# Patient Record
Sex: Male | Born: 1990 | Race: White | Hispanic: No | Marital: Single | Smoking: Former smoker
Health system: Southern US, Community
[De-identification: ages and names within clinical notes are randomized; demographics above are authoritative.]

## PROBLEM LIST (undated history)

## (undated) DIAGNOSIS — J45909 Unspecified asthma, uncomplicated: Secondary | ICD-10-CM

## (undated) DIAGNOSIS — Q048 Other specified congenital malformations of brain: Secondary | ICD-10-CM

## (undated) DIAGNOSIS — T7840XA Allergy, unspecified, initial encounter: Secondary | ICD-10-CM

## (undated) DIAGNOSIS — F411 Generalized anxiety disorder: Secondary | ICD-10-CM

## (undated) DIAGNOSIS — T744XXA Shaken infant syndrome, initial encounter: Secondary | ICD-10-CM

## (undated) DIAGNOSIS — Q046 Congenital cerebral cysts: Secondary | ICD-10-CM

## (undated) DIAGNOSIS — U071 COVID-19: Secondary | ICD-10-CM

## (undated) DIAGNOSIS — F4310 Post-traumatic stress disorder, unspecified: Secondary | ICD-10-CM

## (undated) DIAGNOSIS — R569 Unspecified convulsions: Secondary | ICD-10-CM

## (undated) DIAGNOSIS — F329 Major depressive disorder, single episode, unspecified: Secondary | ICD-10-CM

## (undated) DIAGNOSIS — Q049 Congenital malformation of brain, unspecified: Secondary | ICD-10-CM

## (undated) DIAGNOSIS — Q860 Fetal alcohol syndrome (dysmorphic): Secondary | ICD-10-CM

## (undated) HISTORY — DX: Congenital malformation of brain, unspecified: Q04.9

## (undated) HISTORY — DX: Other specified congenital malformations of brain: Q04.8

## (undated) HISTORY — DX: COVID-19: U07.1

## (undated) HISTORY — DX: Post-traumatic stress disorder, unspecified: F43.10

## (undated) HISTORY — DX: Allergy, unspecified, initial encounter: T78.40XA

## (undated) HISTORY — DX: Congenital cerebral cysts: Q04.6

## (undated) HISTORY — PX: HERNIA REPAIR: SHX51

## (undated) HISTORY — DX: Shaken infant syndrome, initial encounter: T74.4XXA

## (undated) HISTORY — DX: Unspecified convulsions: R56.9

## (undated) HISTORY — DX: Unspecified asthma, uncomplicated: J45.909

---
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
## 2016-02-19 ENCOUNTER — Emergency Department (HOSPITAL_COMMUNITY): Payer: Medicare Other

## 2016-02-19 ENCOUNTER — Encounter (HOSPITAL_COMMUNITY): Payer: Self-pay | Admitting: Emergency Medicine

## 2016-02-19 ENCOUNTER — Emergency Department (HOSPITAL_COMMUNITY)
Admission: EM | Admit: 2016-02-19 | Discharge: 2016-02-19 | Disposition: A | Discharge status: Home / Self Care | Payer: Medicare Other | Attending: Emergency Medicine | Admitting: Emergency Medicine

## 2016-02-19 DIAGNOSIS — Y929 Unspecified place or not applicable: Secondary | ICD-10-CM | POA: Insufficient documentation

## 2016-02-19 DIAGNOSIS — Y939 Activity, unspecified: Secondary | ICD-10-CM | POA: Diagnosis not present

## 2016-02-19 DIAGNOSIS — S6991XA Unspecified injury of right wrist, hand and finger(s), initial encounter: Secondary | ICD-10-CM | POA: Diagnosis present

## 2016-02-19 DIAGNOSIS — Y999 Unspecified external cause status: Secondary | ICD-10-CM | POA: Diagnosis not present

## 2016-02-19 DIAGNOSIS — S6391XA Sprain of unspecified part of right wrist and hand, initial encounter: Secondary | ICD-10-CM | POA: Diagnosis not present

## 2016-02-19 MED ORDER — IBUPROFEN 400 MG PO TABS
800.0000 mg | ORAL_TABLET | Freq: Once | ORAL | Status: AC
  Administered 2016-02-19: 800 mg via ORAL
  Filled 2016-02-19: qty 2

## 2016-02-19 MED ORDER — IBUPROFEN 800 MG PO TABS: 800.0000 mg | ORAL_TABLET | Freq: Three times a day (TID) | ORAL | 0 refills | Status: DC
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
## 2016-02-19 NOTE — Discharge Instructions (Signed)
Take ibuprofen as needed for pain. Rest, ice, elevation and compression. Limit movement. Please return if symptoms fail to improve.

## 2016-04-23 DIAGNOSIS — J4520 Mild intermittent asthma, uncomplicated: Secondary | ICD-10-CM | POA: Insufficient documentation

## 2016-04-23 DIAGNOSIS — Q860 Fetal alcohol syndrome (dysmorphic): Secondary | ICD-10-CM | POA: Insufficient documentation

## 2016-04-23 DIAGNOSIS — F259 Schizoaffective disorder, unspecified: Secondary | ICD-10-CM

## 2016-04-23 DIAGNOSIS — T744XXA Shaken infant syndrome, initial encounter: Secondary | ICD-10-CM | POA: Insufficient documentation

## 2016-04-23 DIAGNOSIS — G40909 Epilepsy, unspecified, not intractable, without status epilepticus: Secondary | ICD-10-CM | POA: Insufficient documentation

## 2016-04-23 DIAGNOSIS — I4581 Long QT syndrome: Secondary | ICD-10-CM | POA: Insufficient documentation

## 2016-04-23 DIAGNOSIS — Z593 Problems related to living in residential institution: Secondary | ICD-10-CM | POA: Insufficient documentation

## 2016-04-23 DIAGNOSIS — F4310 Post-traumatic stress disorder, unspecified: Secondary | ICD-10-CM | POA: Insufficient documentation

## 2016-04-23 DIAGNOSIS — F329 Major depressive disorder, single episode, unspecified: Secondary | ICD-10-CM

## 2016-04-23 DIAGNOSIS — Z7289 Other problems related to lifestyle: Secondary | ICD-10-CM | POA: Insufficient documentation

## 2016-04-23 DIAGNOSIS — Z79899 Other long term (current) drug therapy: Secondary | ICD-10-CM | POA: Insufficient documentation

## 2016-04-23 HISTORY — DX: Major depressive disorder, single episode, unspecified: F32.9

## 2016-04-23 HISTORY — DX: Fetal alcohol syndrome (dysmorphic): Q86.0

## 2016-04-23 HISTORY — DX: Schizoaffective disorder, unspecified: F25.9

## 2016-07-30 DIAGNOSIS — R635 Abnormal weight gain: Secondary | ICD-10-CM | POA: Insufficient documentation

## 2016-07-30 DIAGNOSIS — E669 Obesity, unspecified: Secondary | ICD-10-CM | POA: Insufficient documentation

## 2017-05-03 DIAGNOSIS — E871 Hypo-osmolality and hyponatremia: Secondary | ICD-10-CM | POA: Insufficient documentation
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
## 2018-07-06 ENCOUNTER — Encounter: Payer: Self-pay | Admitting: Neurology

## 2018-07-07 ENCOUNTER — Telehealth: Payer: Self-pay | Admitting: Neurology

## 2018-09-20 ENCOUNTER — Other Ambulatory Visit: Payer: Self-pay

## 2018-09-20 ENCOUNTER — Encounter: Payer: Self-pay | Admitting: Neurology

## 2018-09-20 ENCOUNTER — Ambulatory Visit (INDEPENDENT_AMBULATORY_CARE_PROVIDER_SITE_OTHER): Payer: Medicare Other | Admitting: Neurology

## 2018-09-20 VITALS — BP 112/76 | HR 69 | Ht 72.0 in | Wt 258.0 lb

## 2018-09-20 DIAGNOSIS — G40209 Localization-related (focal) (partial) symptomatic epilepsy and epileptic syndromes with complex partial seizures, not intractable, without status epilepticus: Secondary | ICD-10-CM

## 2018-09-20 DIAGNOSIS — Q048 Other specified congenital malformations of brain: Secondary | ICD-10-CM

## 2018-09-20 DIAGNOSIS — F989 Unspecified behavioral and emotional disorders with onset usually occurring in childhood and adolescence: Secondary | ICD-10-CM | POA: Diagnosis not present

## 2018-09-20 MED ORDER — CARBAMAZEPINE ER 200 MG PO CP12: ORAL_CAPSULE | ORAL | 11 refills | Status: DC
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
## 2018-09-26 ENCOUNTER — Other Ambulatory Visit: Payer: Self-pay | Admitting: Neurology

## 2018-09-26 ENCOUNTER — Encounter: Payer: Self-pay | Admitting: Neurology

## 2018-09-26 ENCOUNTER — Ambulatory Visit (INDEPENDENT_AMBULATORY_CARE_PROVIDER_SITE_OTHER): Payer: Medicare Other | Admitting: Neurology

## 2018-09-26 DIAGNOSIS — Q048 Other specified congenital malformations of brain: Secondary | ICD-10-CM

## 2018-09-26 DIAGNOSIS — F989 Unspecified behavioral and emotional disorders with onset usually occurring in childhood and adolescence: Secondary | ICD-10-CM | POA: Diagnosis not present

## 2018-09-26 DIAGNOSIS — G40209 Localization-related (focal) (partial) symptomatic epilepsy and epileptic syndromes with complex partial seizures, not intractable, without status epilepticus: Secondary | ICD-10-CM | POA: Diagnosis not present

## 2018-09-26 MED ORDER — CARBAMAZEPINE ER 200 MG PO CP12: ORAL_CAPSULE | ORAL | 11 refills | Status: DC

## 2018-09-26 NOTE — Telephone Encounter (Signed)
Carbatrol 200mg  #180 with 11 refills  Forwarded to Dr. Karel Jarvis for approval

## 2018-09-26 NOTE — Telephone Encounter (Signed)
Patient is here for EEG and asked that the carbamazepine medication be sent to the [facility] [address]. Please send this in. Thanks!

## 2018-09-28 ENCOUNTER — Telehealth: Payer: Self-pay | Admitting: Neurology

## 2018-09-28 NOTE — Telephone Encounter (Signed)
Father is calling in about referral that was sent for neuropsych testing to the wrong place. He said it needed to be sent to [facility]? Please call him back at [phone]. He seems confused. Thanks!

## 2018-09-29 NOTE — Telephone Encounter (Signed)
Spoke with pt's father, Austin Kennedy, who states that we were to send referral to behavior health for medication management.  Advised that referral was for management of behavior changes.  Austin Kennedy states that pt already has therapist for that.  Asks for referral for psychiatrist, who can manage pt's medications.  Also states that Dr. Karel Jarvis told him that she would refer to someone that she works "very closely with"  (I am unaware of anyone other that maybe Dr. Donell Beers?)    Dr. Karel Jarvis, pls advise.

## 2018-09-30 NOTE — Telephone Encounter (Signed)
I think there was confusion with the referrals. We wanted to refer him to [facility] for Psychiatry eval and treatment (not counseling). I told them that a lot of our patients have psychiatric conditions so we work closely with psychiatry (not necessarily a specific physician). The other referral (which I think is where the confusion is), is for Neuropsych testing at our office once Dr. Milbert Coulter joins [place]. Maybe the Neuropsych referral was sent to [facility], but it was supposed to be an internal referral here in our office for the future.  Thanks!

## 2018-10-06 NOTE — Procedures (Signed)
ELECTROENCEPHALOGRAM REPORT  Date of Study: 09/26/2018  Patient's Name: Austin Kennedy MRN: [id number] Date of Birth: July 15, 1991  Referring Provider: Dr. Patrcia Dolly  Clinical History: This is a 28 year old man with a history of right schizencephaly, frontal cortical dysplasia, with increasing agitation and rage reactions, EEG for classification  Medications: CARBATROL 200 MG 12 hr capsule  KLONOPIN 1 MG tablet  HALDOL 5 MGtablet   ADVIL,MOTRIN 800 MG tablet  CLARITIN 10 MG tablet  FISH OIL 1200 MG CPDR  GEODON 40 MG capsule   Technical Summary: A multichannel digital 1-hour EEG recording measured by the international 10-20 system with electrodes applied with paste and impedances below 5000 ohms performed in our laboratory with EKG monitoring in an awake and asleep patient.  Hyperventilation and photic stimulation were performed.  The digital EEG was referentially recorded, reformatted, and digitally filtered in a variety of bipolar and referential montages for optimal display.    Description: The patient is awake and asleep during the recording.  During maximal wakefulness, there is a symmetric, medium voltage 9.5-10 Hz posterior dominant rhythm that attenuates with eye opening.  There is rare focal 4-5 Hz theta slowing seen over the right temporal region during drowsiness. During drowsiness and sleep, there is an increase in theta slowing of the background.  Vertex waves and symmetric sleep spindles were seen.  Hyperventilation and photic stimulation did not elicit any abnormalities.  There were no epileptiform discharges or electrographic seizures seen.    EKG lead was unremarkable.  Impression: This 1-hour awake and asleep EEG is abnormal due to rare focal slowing over the right temporal region.  Clinical Correlation of the above findings indicates focal cerebral dysfunction over the right temporal region suggestive of underlying structural or physiologic abnormality. The  absence of epileptiform discharges does not exclude a clinical diagnosis of epilepsy. Clinical correlation is advised.   Patrcia Dolly, M.D.

## 2019-01-17 ENCOUNTER — Telehealth: Payer: Self-pay | Admitting: Neurology

## 2019-01-17 ENCOUNTER — Other Ambulatory Visit: Payer: Self-pay

## 2019-01-17 DIAGNOSIS — G40209 Localization-related (focal) (partial) symptomatic epilepsy and epileptic syndromes with complex partial seizures, not intractable, without status epilepticus: Secondary | ICD-10-CM

## 2019-01-17 DIAGNOSIS — F989 Unspecified behavioral and emotional disorders with onset usually occurring in childhood and adolescence: Secondary | ICD-10-CM

## 2019-01-17 DIAGNOSIS — Q048 Other specified congenital malformations of brain: Secondary | ICD-10-CM

## 2019-01-17 MED ORDER — CARBAMAZEPINE ER 200 MG PO CP12: ORAL_CAPSULE | ORAL | 11 refills | Status: DC

## 2019-01-17 NOTE — Telephone Encounter (Signed)
Thanks for getting records. Yes I had talked to them about 2 referrals:  1. Psychiatry with [facility] (he already has a therapist, he needs medication management)  2. Neuropsychological testing with Dr. Melvyn Novas  Sounds like he has not heard back yet from [facility], pls confirm that he also wanted referral with Psychiatry for medication management then send referral. Thanks!

## 2019-01-17 NOTE — Addendum Note (Signed)
Addended by: Cameron Sprang on: 01/17/2019 04:06 PM   Modules accepted: Orders

## 2019-01-17 NOTE — Telephone Encounter (Signed)
Guardian father is calling in about psychiatrist referral. He hasn't heard anything from anyone and he is saying that it has been awhile since the referral was supposedly put in. He said you can call him back at [phone] or the mother [name] at [phone]. Thanks!

## 2019-01-17 NOTE — Telephone Encounter (Signed)
I had to put in a new referral for Behavioral Health. The receptionist at [facility] location said pt would get in sooner at the [facility] location with Dr. De Nurse. Their office will call the pt with an appt date and time. This practice will be able to manage behavioral changes and his medication (dad was concerned about this).  Dad requests a refill of carbamazepine in the meantime. Would you send in a refill please? He needs 1200mg  qd per dad to manage.

## 2019-01-17 NOTE — Telephone Encounter (Signed)
Pls let dad know I sent in refills for what I did the last visit, 200mg  3 tabs twice a day (which is a total of 1200mg  daily dose). Thanks!

## 2019-01-17 NOTE — Telephone Encounter (Signed)
Dad informed of med refill and referral placed to [facility].

## 2019-01-17 NOTE — Telephone Encounter (Signed)
Pts dad states that he went to [facility] health and he was told that they did not have psychiatrist there.  I presume they went to the wrong office. I will call and check and arrange an appt. Referral has alread been placed. Number is [phone].  Pt needs a refill of carbamazepine. He needs 1200 qd. The [facility] location decreased to 600mg  qd and pt has been very unstable per dad.

## 2019-01-17 NOTE — Telephone Encounter (Addendum)
Spoke with pts father. He states that pt is not well and that he needs psychiatric stability help.  Pt has increased to 1200 of carbamazepine daily.  Dad requested that we communicate with Jalene Mullet at [place] about pt.  Informed dad that Dr. Delice Lesch would like for pt to see Dr. Melvyn Novas in office. This will be in August. Dad was appreciative of that.  I have left a message at [facility] office to fax over office notes

## 2019-02-27 ENCOUNTER — Encounter (HOSPITAL_COMMUNITY): Payer: Self-pay | Admitting: Psychiatry

## 2019-02-27 ENCOUNTER — Ambulatory Visit (INDEPENDENT_AMBULATORY_CARE_PROVIDER_SITE_OTHER): Payer: Medicaid Other | Admitting: Psychiatry

## 2019-02-27 DIAGNOSIS — F4310 Post-traumatic stress disorder, unspecified: Secondary | ICD-10-CM

## 2019-02-27 DIAGNOSIS — F258 Other schizoaffective disorders: Secondary | ICD-10-CM

## 2019-02-27 DIAGNOSIS — F259 Schizoaffective disorder, unspecified: Secondary | ICD-10-CM

## 2019-02-27 DIAGNOSIS — Q860 Fetal alcohol syndrome (dysmorphic): Secondary | ICD-10-CM | POA: Diagnosis not present

## 2019-02-27 DIAGNOSIS — G40909 Epilepsy, unspecified, not intractable, without status epilepticus: Secondary | ICD-10-CM

## 2019-02-27 DIAGNOSIS — F0630 Mood disorder due to known physiological condition, unspecified: Secondary | ICD-10-CM | POA: Diagnosis not present

## 2019-02-27 MED ORDER — CLONAZEPAM 1 MG PO TABS: ORAL_TABLET | ORAL | 1 refills | Status: DC

## 2019-02-27 MED ORDER — HALOPERIDOL 5 MG PO TABS: 2.5000 mg | ORAL_TABLET | Freq: Every day | ORAL | 2 refills | Status: DC

## 2019-02-27 MED ORDER — ZIPRASIDONE HCL 40 MG PO CAPS: 40.0000 mg | ORAL_CAPSULE | Freq: Two times a day (BID) | ORAL | 1 refills | Status: DC
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
## 2019-03-10 ENCOUNTER — Telehealth (HOSPITAL_COMMUNITY): Payer: Self-pay | Admitting: Psychiatry

## 2019-03-10 NOTE — Telephone Encounter (Signed)
[place] calling States that you wanted Dr Delice Lesch to manage his klonopin and carbattrol. Dr. Delice Lesch states she was fine with that, she would just need a note or a letter to say which meds specifically you would like her to manage. She is part of [employer], so you can send her a message in Epic or type something up and we can fax it to her.   Any questions you can call dad back at [phone]

## 2019-03-10 NOTE — Telephone Encounter (Signed)
Can you forward or print/fax this message back to her. Dr. Delice Lesch can manage patients Klonopine and Carbatrol for her medical condition as she has history of seizures.  Thanks [employer]

## 2019-04-06 ENCOUNTER — Other Ambulatory Visit: Payer: Self-pay

## 2019-04-06 ENCOUNTER — Ambulatory Visit (INDEPENDENT_AMBULATORY_CARE_PROVIDER_SITE_OTHER): Payer: Medicaid Other | Admitting: Psychiatry

## 2019-04-06 ENCOUNTER — Encounter (HOSPITAL_COMMUNITY): Payer: Self-pay | Admitting: Psychiatry

## 2019-04-06 DIAGNOSIS — F258 Other schizoaffective disorders: Secondary | ICD-10-CM | POA: Diagnosis not present

## 2019-04-06 DIAGNOSIS — F259 Schizoaffective disorder, unspecified: Secondary | ICD-10-CM

## 2019-04-06 DIAGNOSIS — F4310 Post-traumatic stress disorder, unspecified: Secondary | ICD-10-CM

## 2019-04-06 DIAGNOSIS — Q860 Fetal alcohol syndrome (dysmorphic): Secondary | ICD-10-CM | POA: Diagnosis not present

## 2019-04-06 DIAGNOSIS — G40909 Epilepsy, unspecified, not intractable, without status epilepticus: Secondary | ICD-10-CM
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
## 2019-04-25 ENCOUNTER — Telehealth: Payer: Self-pay | Admitting: Neurology

## 2019-04-25 ENCOUNTER — Ambulatory Visit (INDEPENDENT_AMBULATORY_CARE_PROVIDER_SITE_OTHER): Payer: Medicare Other | Admitting: Psychology

## 2019-04-25 ENCOUNTER — Telehealth (HOSPITAL_COMMUNITY): Payer: Self-pay | Admitting: Psychiatry

## 2019-04-25 ENCOUNTER — Encounter: Payer: Self-pay | Admitting: Psychology

## 2019-04-25 ENCOUNTER — Other Ambulatory Visit: Payer: Self-pay

## 2019-04-25 ENCOUNTER — Ambulatory Visit: Payer: Medicare Other

## 2019-04-25 DIAGNOSIS — Q049 Congenital malformation of brain, unspecified: Secondary | ICD-10-CM | POA: Diagnosis not present

## 2019-04-25 DIAGNOSIS — T744XXS Shaken infant syndrome, sequela: Secondary | ICD-10-CM

## 2019-04-25 DIAGNOSIS — Q046 Congenital cerebral cysts: Secondary | ICD-10-CM

## 2019-04-25 DIAGNOSIS — F4310 Post-traumatic stress disorder, unspecified: Secondary | ICD-10-CM

## 2019-04-25 DIAGNOSIS — F79 Unspecified intellectual disabilities: Secondary | ICD-10-CM | POA: Diagnosis not present

## 2019-04-25 DIAGNOSIS — T744XXA Shaken infant syndrome, initial encounter: Secondary | ICD-10-CM | POA: Insufficient documentation

## 2019-04-25 DIAGNOSIS — G40909 Epilepsy, unspecified, not intractable, without status epilepticus: Secondary | ICD-10-CM | POA: Diagnosis not present

## 2019-04-25 DIAGNOSIS — Q048 Other specified congenital malformations of brain: Secondary | ICD-10-CM

## 2019-04-25 DIAGNOSIS — F411 Generalized anxiety disorder: Secondary | ICD-10-CM

## 2019-04-25 HISTORY — DX: Generalized anxiety disorder: F41.1

## 2019-04-25 MED ORDER — ZIPRASIDONE HCL 40 MG PO CAPS: 40.0000 mg | ORAL_CAPSULE | Freq: Two times a day (BID) | ORAL | 1 refills | Status: DC

## 2019-04-25 NOTE — Telephone Encounter (Signed)
Left message with after hour service on 04-25-19 @ 12:51 pm    Patient father states that the patient needs a RX for the Name Brand of medication Metocaramol called into the [facility] on [address]. The RX must say Name Brand on it or they will fill with the Generic

## 2019-04-25 NOTE — Telephone Encounter (Signed)
I have sent for Geodon brand name, Yevette Edwards is for seizures and have been prescribed by primary care or neurologist . They can contact their office

## 2019-04-25 NOTE — Telephone Encounter (Signed)
Mr ringler calling.  States that pt needs to take Brand name rx of Zipraisidone (geodon), and Carbamazepine (carbatrol).  He just picked these rx up last week and states these gerics are not working as well as the brand name. He would like [place] to recall in the rx, and is aware he may have to pay full price since insurance may not pay for a new rx.   Please advise.   [facility] [address]

## 2019-04-25 NOTE — Telephone Encounter (Signed)
Lm for dad informing him of the following.  Call back with questions or concerns.   Nothing Further Needed at this time.

## 2019-04-25 NOTE — Progress Notes (Signed)
   Neuropsychology Note   Warnell Bureau Mapes completed 150 minutes of neuropsychological testing with technician, Cruzita Lederer, B.S., under the supervision of Dr. Christia Reading, Ph.D., licensed neuropsychologist. The patient did not appear overtly distressed by the testing session, per behavioral observation or via self-report to the technician. Rest breaks were offered.    In considering the patient's current level of functioning, level of presumed impairment, nature of symptoms, emotional and behavioral responses during the interview, level of literacy, and observed level of motivation/effort, a battery of tests was selected and communicated to the psychometrician.   Communication between the psychologist and technician was ongoing throughout the testing session and changes were made as deemed necessary based on patient performance on testing, technician observations and additional pertinent factors such as those listed above.   Warnell Bureau Donaho will return within approximately two weeks for an interactive feedback session with Dr. Melvyn Novas at which time his test performances, clinical impressions, and treatment recommendations will be reviewed in detail. The patient understands he can contact our office should he require our assistance before this time.   Full report to follow.  150 minutes were spent face-to-face with patient administering standardized tests and 15 minutes were spent scoring (technician). [CPT [id number], [id number]]

## 2019-04-25 NOTE — Progress Notes (Signed)
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 Learning Delayed Recall 31 3 Well Below Average  Spatial Domain 73 4 Well Below Average    Visual Discrimination 39 14 Below Average    Design Construction 28 2 Exceptionally Low  Executive Functions Domain 62 1 Exceptionally Low    Mazes 32 4 Well Below Average    Word Generation 27 1 Exceptionally Low       Intellectual Functioning:          Wechsler Adult Intelligence Scale (WAIS-IV): Standard Score/ Scaled Score Percentile   Full Scale IQ  65 1 Exceptionally Low  GAI 76 5 Well Below Average  Verbal Comprehension Index: 78 7 Well Below Average    Similarities  6 9 Below Average    Vocabulary 6 9 Below Average    Information  6 9 Below Average  Perceptual Reasoning Index:  79 8 Well Below Average    Block Design  6 9 Below Average    Matrix Reasoning  5 5 Well Below Average    Visual Puzzles 8 25 Average  Working Memory Index: 63 1 Exceptionally Low    Digit Span 3 1 Exceptionally Low    Arithmetic  4 2 Well Below Average  Processing Speed Index: 53 <1 Exceptionally Low    Symbol Search  2 <1 Exceptionally Low    Coding 1 <1 Exceptionally Low       Memory:          [place] Verbal Learning Test (CVLT-III) Brief Form: Raw Score (Scaled/Standard Score) Percentile     Total Trials 1-4  19/36 (61) <1 Exceptionally Low    Short-Delay Free Recall 0/9 (1) <1 Exceptionally Low    Long-Delay Free Recall 4/9 (5) 5 Well Below Average    Long-Delay Cued Recall 3/9 (1) <1 Exceptionally Low      Recognition Hits 5/9 (3) 1 Exceptionally Low      False Positive Errors 1 (7) 16 Below Average       Attention/Executive Function:          Trail Making Test (TMT): Raw Score  (T Score) Percentile     Part A 44 secs.,  1 error (31) 3 Well Below Average    Part B 267 secs.,  2 errors (13) <1 Exceptionally Low        Scaled Score Percentile   WAIS-IV Digit Span: 3 1 Exceptionally Low    Forward 4 2 Well Below Average    Backward 4 2 Well Below Average    Sequencing 4 2 Well Below Average       D-KEFS Color-Word Interference Test: Raw Score (Scaled Score) Percentile     Color Naming 89 secs. (1) <1 Exceptionally Low    Word Reading 49 secs. (1) <1 Exceptionally Low    Inhibition Discontinued  at 180 secs. --- Impaired    Inhibition/Switching Discontinued  at 180 secs. --- Impaired       D-KEFS Verbal Fluency Test: Raw Score (Scaled Score) Percentile     Letter Total Correct 7 (1) <1 Exceptionally Low    Category Total Correct 18 (2) <1 Exceptionally  Low    Category Switching Total Correct 7 (2) <1 Exceptionally Low    Category Switching Accuracy 6 (4) 2 Well Below Average      Total Set Loss Errors 0 (13) 84 Above Average      Total Repetition Errors 1 (11) 63 Average       D-KEFS 20 Questions Test: Scaled Score Percentile     Total Weighted Achievement Score 9 37 Average    Initial Abstraction Score 8 25 Average       Visuospatial/Visuoconstruction:          NAB Spatial Module, Form 2: T Score Percentile     Figure Drawing Copy 48 42 Average       Sensory-Motor:          [place] Grooved Pegboard Test: Raw Score Percentile     Dominant Hand 112 secs.,  1 drop  <1 Exceptionally Low    Non-Dominant Hand 11 pegs at 150 secs., 11 drops --- Impaired       Mood and  Personality:      Raw Score Percentile   Beck Depression Inventory - II: 30 --- Severe  PROMIS Anxiety Questionnaire: 35 --- Severe  PTSD Checklist for DSM-5: 55 --- Above Threshold       Additional Questionnaires:      Raw Score Percentile   PROMIS Sleep Disturbance Questionnaire: 19 --- None to Slight   Informed Consent and Coding/Compliance:   Mr. Chock was provided with a verbal description of the nature and purpose of the present neuropsychological evaluation. Also reviewed were the foreseeable risks and/or discomforts and benefits of the procedure, limits of confidentiality, and mandatory reporting requirements of this provider. The patient was given the opportunity to ask questions and receive answers about the evaluation. Oral consent to participate was provided by the patient.   This evaluation was conducted by Christia Reading, Ph.D., licensed clinical neuropsychologist. Mr. Roupp completed a 30-minute clinical interview, billed as one unit [phone], and 165 minutes of cognitive testing, billed as one unit [phone] and five additional units [phone]. Psychometrist Cruzita Lederer, B.S., assisted Dr. Melvyn Novas with test administration and scoring procedures. As a separate and discrete service, Dr. Melvyn Novas spent a total of 180 minutes in interpretation and report writing, billed as one unit 96132 and two units 96133.

## 2019-04-26 ENCOUNTER — Encounter: Payer: Self-pay | Admitting: Psychology

## 2019-04-26 NOTE — Telephone Encounter (Signed)
Left message for Xaiden or his father to return call. Do they mean Carbatrol?

## 2019-04-28 ENCOUNTER — Ambulatory Visit: Payer: Medicare Other | Admitting: Neurology

## 2019-05-02 ENCOUNTER — Encounter: Payer: Medicare Other | Admitting: Psychology

## 2019-05-02 ENCOUNTER — Other Ambulatory Visit: Payer: Self-pay

## 2019-05-02 MED ORDER — CARBATROL 200 MG PO CP12: ORAL_CAPSULE | ORAL | 3 refills | Status: DC

## 2019-05-02 MED ORDER — CARBAMAZEPINE ER 200 MG PO CP12
ORAL_CAPSULE | ORAL | 3 refills | Status: DC
Start: 2019-05-02 — End: 2019-05-02

## 2019-05-04 ENCOUNTER — Encounter: Payer: Self-pay | Admitting: Psychology

## 2019-05-04 ENCOUNTER — Other Ambulatory Visit: Payer: Self-pay

## 2019-05-04 ENCOUNTER — Telehealth: Payer: Self-pay | Admitting: Neurology

## 2019-05-04 ENCOUNTER — Ambulatory Visit (INDEPENDENT_AMBULATORY_CARE_PROVIDER_SITE_OTHER): Payer: Medicare Other | Admitting: Psychology

## 2019-05-04 DIAGNOSIS — F329 Major depressive disorder, single episode, unspecified: Secondary | ICD-10-CM

## 2019-05-04 DIAGNOSIS — Q046 Congenital cerebral cysts: Secondary | ICD-10-CM

## 2019-05-04 DIAGNOSIS — Q049 Congenital malformation of brain, unspecified: Secondary | ICD-10-CM

## 2019-05-04 DIAGNOSIS — F4310 Post-traumatic stress disorder, unspecified: Secondary | ICD-10-CM

## 2019-05-04 DIAGNOSIS — G40909 Epilepsy, unspecified, not intractable, without status epilepticus: Secondary | ICD-10-CM

## 2019-05-04 DIAGNOSIS — F79 Unspecified intellectual disabilities: Secondary | ICD-10-CM

## 2019-05-04 DIAGNOSIS — Q860 Fetal alcohol syndrome (dysmorphic): Secondary | ICD-10-CM

## 2019-05-04 NOTE — Progress Notes (Signed)
   NEUROPSYCHOLOGICAL EVALUATION - Feedback [name]. [facility] Department of Neurology  Reason for Referral:   Austin Kennedy a 28 y.o. 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  Feedback:   Austin Kennedy completed a comprehensive neuropsychological evaluation on 04/25/2019. 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 Neuropsychologically speaking, Austin Kennedy cognitive profile exhibited relatively diffuse impairment, often in the exceptionally low normative ranges. Relative strengths were exhibited across receptive and expressive language, as well as visuoconstructional abilities. Prominent weaknesses were exhibited across domains of processing speed, attention/concentration, and executive functioning. Regarding his mood, Austin Kennedy endorsed severe rates of acute depression and anxiety, which can negatively impact cognitive domains of processing speed, attention/concentration, and executive functioning. Likewise, scores on a PTSD questionnaire suggested acute symptoms consistent with an active diagnosis. Recommendations included regular engagement in outpatient psychotherapy to address mood-related concerns and to curb emotional outbursts.  Austin Kennedy was accompanied by his adopted father. His adopted mother also joined via speakerphone. Content of the current session focused on the results of the current evaluation and their relation to previous evaluations, as well as  likely etiologies for observed deficits. We also discussed vocational rehabilitation as a reasonable way for Austin Kennedy to attempt to re-enter the work force. Austin Kennedy and his family were given the opportunity to ask questions and all their questions were answered. They were also encouraged to reach out should additional questions arise. A copy of his report was provided at the conclusion of the visit.      A total of 20 minutes were spent with Austin Kennedy and his family during the current feedback session.

## 2019-05-04 NOTE — Telephone Encounter (Signed)
Patient's Father came by the office today to drop off some paperwork for Dr. Melvyn Novas to review and also to ask Dr. Delice Lesch about his medicationTegretol. He said that Dr. Marlou Sa had mentioned that the Generic was not working for Try and he needing the Brand name instead. He uses [facility] on [address].  Please Call [phone]. Thanks

## 2019-05-05 NOTE — Telephone Encounter (Signed)
I sent in the brand name. I believe this is an old telephone message.

## 2019-05-05 NOTE — Telephone Encounter (Signed)
The last time I prescribed it was the Brand Carbatrol, what is the issue, maybe insurance?

## 2019-05-08 ENCOUNTER — Ambulatory Visit (INDEPENDENT_AMBULATORY_CARE_PROVIDER_SITE_OTHER): Payer: Medicaid Other | Admitting: Psychiatry

## 2019-05-08 ENCOUNTER — Encounter (HOSPITAL_COMMUNITY): Payer: Self-pay | Admitting: Psychiatry

## 2019-05-08 DIAGNOSIS — F259 Schizoaffective disorder, unspecified: Secondary | ICD-10-CM | POA: Diagnosis not present

## 2019-05-08 DIAGNOSIS — Q860 Fetal alcohol syndrome (dysmorphic): Secondary | ICD-10-CM | POA: Diagnosis not present

## 2019-05-08 DIAGNOSIS — G40909 Epilepsy, unspecified, not intractable, without status epilepticus: Secondary | ICD-10-CM

## 2019-05-08 DIAGNOSIS — F4310 Post-traumatic stress disorder, unspecified: Secondary | ICD-10-CM | POA: Diagnosis not present

## 2019-05-08 MED ORDER — HALOPERIDOL 5 MG PO TABS: 2.5000 mg | ORAL_TABLET | Freq: Every day | ORAL | 2 refills | Status: DC

## 2019-05-08 MED ORDER — CLONAZEPAM 1 MG PO TABS: ORAL_TABLET | ORAL | 1 refills | Status: DC
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
## 2019-05-11 ENCOUNTER — Other Ambulatory Visit: Payer: Self-pay

## 2019-05-11 MED ORDER — CARBATROL 200 MG PO CP12: ORAL_CAPSULE | ORAL | 3 refills | Status: DC

## 2019-05-12 DIAGNOSIS — K5909 Other constipation: Secondary | ICD-10-CM | POA: Insufficient documentation

## 2019-05-15 ENCOUNTER — Telehealth (HOSPITAL_COMMUNITY): Payer: Self-pay

## 2019-05-15 MED ORDER — ZIPRASIDONE HCL 40 MG PO CAPS: 40.0000 mg | ORAL_CAPSULE | Freq: Two times a day (BID) | ORAL | 1 refills | Status: DC

## 2019-05-15 NOTE — Telephone Encounter (Signed)
Resent in Geodon generic form so that medicaid will pay for medication.

## 2019-05-15 NOTE — Telephone Encounter (Signed)
Pharmacy states that medicaid will not pay for brand name Geodon 40mg . It is stated in the rx that brand name is medically necessary. They will only pay for generic. Please advise or change rx to generic. Thanks

## 2019-05-15 NOTE — Telephone Encounter (Signed)
Please let patient know about this and if he agrees change to generic

## 2019-05-18 ENCOUNTER — Other Ambulatory Visit: Payer: Self-pay

## 2019-05-18 ENCOUNTER — Telehealth (INDEPENDENT_AMBULATORY_CARE_PROVIDER_SITE_OTHER): Payer: Medicare Other | Admitting: Neurology

## 2019-05-18 ENCOUNTER — Encounter: Payer: Self-pay | Admitting: Neurology

## 2019-05-18 VITALS — Ht 72.0 in | Wt 240.0 lb

## 2019-05-18 DIAGNOSIS — G40209 Localization-related (focal) (partial) symptomatic epilepsy and epileptic syndromes with complex partial seizures, not intractable, without status epilepticus: Secondary | ICD-10-CM | POA: Diagnosis not present

## 2019-05-18 DIAGNOSIS — F989 Unspecified behavioral and emotional disorders with onset usually occurring in childhood and adolescence: Secondary | ICD-10-CM

## 2019-05-18 DIAGNOSIS — F4310 Post-traumatic stress disorder, unspecified: Secondary | ICD-10-CM

## 2019-05-18 DIAGNOSIS — Q048 Other specified congenital malformations of brain: Secondary | ICD-10-CM
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
## 2019-06-16 ENCOUNTER — Other Ambulatory Visit (HOSPITAL_COMMUNITY): Payer: Self-pay | Admitting: Psychiatry

## 2019-07-24 ENCOUNTER — Ambulatory Visit (INDEPENDENT_AMBULATORY_CARE_PROVIDER_SITE_OTHER): Payer: Medicare Other | Admitting: Psychiatry

## 2019-07-24 ENCOUNTER — Encounter (HOSPITAL_COMMUNITY): Payer: Self-pay | Admitting: Psychiatry

## 2019-07-24 DIAGNOSIS — Q860 Fetal alcohol syndrome (dysmorphic): Secondary | ICD-10-CM

## 2019-07-24 DIAGNOSIS — F4310 Post-traumatic stress disorder, unspecified: Secondary | ICD-10-CM

## 2019-07-24 DIAGNOSIS — F259 Schizoaffective disorder, unspecified: Secondary | ICD-10-CM | POA: Diagnosis not present

## 2019-07-24 DIAGNOSIS — G40909 Epilepsy, unspecified, not intractable, without status epilepticus: Secondary | ICD-10-CM | POA: Diagnosis not present

## 2019-07-24 DIAGNOSIS — F0630 Mood disorder due to known physiological condition, unspecified: Secondary | ICD-10-CM | POA: Diagnosis not present

## 2019-07-24 MED ORDER — ZIPRASIDONE HCL 40 MG PO CAPS: 40.0000 mg | ORAL_CAPSULE | Freq: Two times a day (BID) | ORAL | 1 refills | Status: DC

## 2019-07-24 MED ORDER — HALOPERIDOL 5 MG PO TABS: ORAL_TABLET | ORAL | 1 refills | Status: DC
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
## 2019-08-25 ENCOUNTER — Telehealth (HOSPITAL_COMMUNITY): Payer: Self-pay

## 2019-08-25 MED ORDER — CLONAZEPAM 1 MG PO TABS: ORAL_TABLET | ORAL | 0 refills | Status: DC

## 2019-08-25 NOTE — Telephone Encounter (Signed)
Patient needs refill on Cl[facility]azepam sent to [facility] [address] in [place]

## 2019-08-25 NOTE — Telephone Encounter (Signed)
sent 

## 2019-09-04 ENCOUNTER — Telehealth (HOSPITAL_COMMUNITY): Payer: Self-pay | Admitting: Psychiatry

## 2019-09-04 ENCOUNTER — Other Ambulatory Visit (HOSPITAL_COMMUNITY): Payer: Self-pay | Admitting: Psychiatry

## 2019-09-04 MED ORDER — ZIPRASIDONE HCL 40 MG PO CAPS: 40.0000 mg | ORAL_CAPSULE | Freq: Two times a day (BID) | ORAL | 1 refills | Status: DC

## 2019-09-04 NOTE — Telephone Encounter (Signed)
Neither one of them are here today.  Please advise.

## 2019-09-04 NOTE — Telephone Encounter (Signed)
Ok Thanks Dr. Tenny Craw

## 2019-09-04 NOTE — Telephone Encounter (Signed)
We have faxed it from [place] office

## 2019-09-04 NOTE — Telephone Encounter (Signed)
[facility] regarding the [facility].  The [facility] and [facility] will not accept a electronic rx for the geodon if it needs to be "brand name only". You will have to send a faxed rx to their pharmacy.   [facility] at # [phone]  Mom also called and states she needs this ASAP because he is completely out.

## 2019-09-04 NOTE — Telephone Encounter (Signed)
I just talked to pharmacy. Please ask Dr. Milana Kidney or Leonette Most to send this fax as brand name , can keep this note that I agree to it.

## 2019-09-18 ENCOUNTER — Other Ambulatory Visit (HOSPITAL_COMMUNITY): Payer: Self-pay | Admitting: Psychiatry

## 2019-09-19 ENCOUNTER — Telehealth: Payer: Self-pay | Admitting: Neurology

## 2019-09-19 ENCOUNTER — Other Ambulatory Visit: Payer: Self-pay

## 2019-09-19 NOTE — Telephone Encounter (Signed)
Patient's newer care giver called requesting refills on clonazepam 1 MG to be sent to the pharmacy below. He said they've requested it but it has not been sent.  [address] [facility]

## 2019-09-19 NOTE — Telephone Encounter (Signed)
Patient's caregiver came by the office to have the patient's Clonazepam medication 1 MG. He said that the patient will be out today and needs his medication filled at [address] [facility]. Please Call. Thank you. Does he need a Follow Up Scheduled? Please Advise. Thank you

## 2019-09-19 NOTE — Telephone Encounter (Signed)
Prescription was sent today by his psychiatrist, not sure why we are getting this?

## 2019-09-22 NOTE — Telephone Encounter (Signed)
Has this been completed?  Sending to clinical staff for review: Okay to sign/close encounter or is further follow up needed? 

## 2019-10-09 ENCOUNTER — Other Ambulatory Visit (HOSPITAL_COMMUNITY): Payer: Self-pay | Admitting: Psychiatry

## 2019-10-11 ENCOUNTER — Other Ambulatory Visit (HOSPITAL_COMMUNITY): Payer: Self-pay | Admitting: Psychiatry

## 2019-10-12 ENCOUNTER — Other Ambulatory Visit: Payer: Self-pay | Admitting: Neurology

## 2019-10-12 ENCOUNTER — Other Ambulatory Visit (HOSPITAL_COMMUNITY): Payer: Self-pay | Admitting: Psychiatry

## 2019-10-12 NOTE — Telephone Encounter (Signed)
Mother states patients is out of medication. She would like to know why we will not call in the Klonopin. Please call her and let her know what is going on   [facility]

## 2019-10-13 ENCOUNTER — Telehealth (HOSPITAL_COMMUNITY): Payer: Self-pay

## 2019-10-13 ENCOUNTER — Other Ambulatory Visit (HOSPITAL_COMMUNITY): Payer: Self-pay | Admitting: Psychiatry

## 2019-10-13 ENCOUNTER — Telehealth: Payer: Self-pay | Admitting: Neurology

## 2019-10-13 NOTE — Telephone Encounter (Signed)
Hi Shannon, I wanted you and the provider to be aware of Dr. Lavella Lemons message. Thanks

## 2019-10-13 NOTE — Telephone Encounter (Signed)
I have sent refill. It was started by neurology and patient has history of seizures, was supposed to be continued by them . Will send this time discuss with neurology for long term if needed

## 2019-10-13 NOTE — Telephone Encounter (Signed)
Advised Austin Kennedy clonazepam rx needs to be filled by psychiatry.

## 2019-10-13 NOTE — Telephone Encounter (Signed)
Dad called stating that patient needs refill on [facility]ep[facility] [address]. Patient is having a really hard time with anxiety right now and only 30 tabs were sent in for him to take 1/2 tab at 12p and 1 at 8p.

## 2019-10-13 NOTE — Telephone Encounter (Signed)
Looks like his psychiatrist sent a prescription on 09/19/19. He will have to ask the psychiatrist for refills for this medication, psychiatry has been prescribing this the past few months. Thanks

## 2019-10-13 NOTE — Telephone Encounter (Signed)
Patient's father called and said the patient is in an " anxiety crisis state."   He said he has already reached out to the psychiatrist about this and was told Dr. Karel Jarvis will need to refill his Klonopin 1 MG one more time.  [facility] [address]

## 2019-10-13 NOTE — Telephone Encounter (Signed)
Left message with after hour service 10-12-19 @ 8:12 pm  Caller states he is a care giver at the patient group home  Patient needs a refill on the clonazepam the patient is out of medication caller states they would like [facility] [phone] they close at 10 pm

## 2019-10-13 NOTE — Telephone Encounter (Signed)
Advised patients father they need to contact Dr Gilmore Laroche for refills.

## 2019-10-24 NOTE — Telephone Encounter (Signed)
Close encounter 

## 2019-11-07 ENCOUNTER — Telehealth (HOSPITAL_COMMUNITY): Payer: Self-pay

## 2019-11-07 MED ORDER — ZIPRASIDONE HCL 40 MG PO CAPS: 40.0000 mg | ORAL_CAPSULE | Freq: Two times a day (BID) | ORAL | 1 refills | Status: DC

## 2019-11-07 NOTE — Telephone Encounter (Signed)
Please let patient and family know, maybe he was already getting generic before and we can correct it to pharmacy then

## 2019-11-07 NOTE — Telephone Encounter (Signed)
Resent to pharmacy for generic per Dr. Gilmore Laroche

## 2019-11-07 NOTE — Telephone Encounter (Signed)
Yes you can send it or let pharmacy know generic ok

## 2019-11-07 NOTE — Telephone Encounter (Signed)
I spoke with mom and she states that patient will be able to tolerate the generic Geodon. Can we [facility] [place]

## 2019-11-07 NOTE — Telephone Encounter (Signed)
[facility] [address] in [place] sent fax stating that insurance will not pay for brand name Geodon. It is written in the rx that brand name is medically necessary. Please advise

## 2019-11-13 ENCOUNTER — Encounter (HOSPITAL_COMMUNITY): Payer: Self-pay | Admitting: Psychiatry

## 2019-11-13 ENCOUNTER — Telehealth (INDEPENDENT_AMBULATORY_CARE_PROVIDER_SITE_OTHER): Payer: Medicare Other | Admitting: Psychiatry

## 2019-11-13 DIAGNOSIS — G40909 Epilepsy, unspecified, not intractable, without status epilepticus: Secondary | ICD-10-CM

## 2019-11-13 DIAGNOSIS — F259 Schizoaffective disorder, unspecified: Secondary | ICD-10-CM | POA: Diagnosis not present

## 2019-11-13 DIAGNOSIS — F4310 Post-traumatic stress disorder, unspecified: Secondary | ICD-10-CM | POA: Diagnosis not present

## 2019-11-13 MED ORDER — CLONAZEPAM 1 MG PO TABS: ORAL_TABLET | ORAL | 0 refills | Status: DC

## 2019-11-13 MED ORDER — HALOPERIDOL 5 MG PO TABS: ORAL_TABLET | ORAL | 1 refills | Status: DC
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
## 2019-12-25 ENCOUNTER — Other Ambulatory Visit: Payer: Self-pay

## 2019-12-25 ENCOUNTER — Encounter: Payer: Self-pay | Admitting: Neurology

## 2019-12-25 ENCOUNTER — Telehealth (INDEPENDENT_AMBULATORY_CARE_PROVIDER_SITE_OTHER): Payer: Medicare Other | Admitting: Neurology

## 2019-12-25 DIAGNOSIS — G40209 Localization-related (focal) (partial) symptomatic epilepsy and epileptic syndromes with complex partial seizures, not intractable, without status epilepticus: Secondary | ICD-10-CM

## 2019-12-25 DIAGNOSIS — F411 Generalized anxiety disorder: Secondary | ICD-10-CM | POA: Diagnosis not present

## 2019-12-25 DIAGNOSIS — Q048 Other specified congenital malformations of brain: Secondary | ICD-10-CM

## 2019-12-25 DIAGNOSIS — F989 Unspecified behavioral and emotional disorders with onset usually occurring in childhood and adolescence: Secondary | ICD-10-CM
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
## 2019-12-26 MED ORDER — CLONAZEPAM 1 MG PO TABS: ORAL_TABLET | ORAL | 5 refills | Status: DC

## 2019-12-26 MED ORDER — CARBATROL 200 MG PO CP12: ORAL_CAPSULE | ORAL | 3 refills | Status: DC

## 2020-01-06 ENCOUNTER — Other Ambulatory Visit (HOSPITAL_COMMUNITY): Payer: Self-pay | Admitting: Psychiatry

## 2020-02-08 ENCOUNTER — Telehealth (HOSPITAL_COMMUNITY): Payer: Medicare Other | Admitting: Psychiatry

## 2020-02-09 ENCOUNTER — Telehealth (INDEPENDENT_AMBULATORY_CARE_PROVIDER_SITE_OTHER): Payer: Medicare Other | Admitting: Psychiatry

## 2020-02-09 ENCOUNTER — Encounter (HOSPITAL_COMMUNITY): Payer: Self-pay | Admitting: Psychiatry

## 2020-02-09 DIAGNOSIS — F4310 Post-traumatic stress disorder, unspecified: Secondary | ICD-10-CM

## 2020-02-09 DIAGNOSIS — G40909 Epilepsy, unspecified, not intractable, without status epilepticus: Secondary | ICD-10-CM | POA: Diagnosis not present

## 2020-02-09 DIAGNOSIS — F259 Schizoaffective disorder, unspecified: Secondary | ICD-10-CM | POA: Diagnosis not present

## 2020-02-09 MED ORDER — ZIPRASIDONE HCL 40 MG PO CAPS: 40.0000 mg | ORAL_CAPSULE | Freq: Two times a day (BID) | ORAL | 1 refills | Status: DC
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
## 2020-02-12 ENCOUNTER — Telehealth (HOSPITAL_COMMUNITY): Payer: Medicare Other | Admitting: Psychiatry

## 2020-02-29 ENCOUNTER — Other Ambulatory Visit (HOSPITAL_COMMUNITY): Payer: Self-pay | Admitting: Psychiatry

## 2020-03-04 ENCOUNTER — Other Ambulatory Visit (HOSPITAL_COMMUNITY): Payer: Self-pay | Admitting: Psychiatry

## 2020-03-04 MED ORDER — ZIPRASIDONE HCL 40 MG PO CAPS: 40.0000 mg | ORAL_CAPSULE | Freq: Two times a day (BID) | ORAL | 0 refills | Status: DC

## 2020-03-25 ENCOUNTER — Other Ambulatory Visit (HOSPITAL_COMMUNITY): Payer: Self-pay | Admitting: Psychiatry

## 2020-04-29 ENCOUNTER — Telehealth (HOSPITAL_COMMUNITY): Payer: Self-pay | Admitting: Psychiatry

## 2020-04-29 MED ORDER — ZIPRASIDONE HCL 40 MG PO CAPS: ORAL_CAPSULE | ORAL | 0 refills | Status: DC

## 2020-04-29 NOTE — Telephone Encounter (Signed)
Pt needs refill on ziprasidone  [facility] [place]  cb [phone]

## 2020-04-29 NOTE — Telephone Encounter (Signed)
sent 

## 2020-04-30 ENCOUNTER — Other Ambulatory Visit (HOSPITAL_COMMUNITY): Payer: Self-pay

## 2020-04-30 MED ORDER — ZIPRASIDONE HCL 40 MG PO CAPS: ORAL_CAPSULE | ORAL | 0 refills | Status: DC

## 2020-05-08 ENCOUNTER — Telehealth (INDEPENDENT_AMBULATORY_CARE_PROVIDER_SITE_OTHER): Payer: Medicare Other | Admitting: Psychiatry

## 2020-05-08 ENCOUNTER — Encounter (HOSPITAL_COMMUNITY): Payer: Self-pay | Admitting: Psychiatry

## 2020-05-08 DIAGNOSIS — G40909 Epilepsy, unspecified, not intractable, without status epilepticus: Secondary | ICD-10-CM | POA: Diagnosis not present

## 2020-05-08 DIAGNOSIS — F259 Schizoaffective disorder, unspecified: Secondary | ICD-10-CM | POA: Diagnosis not present

## 2020-05-08 DIAGNOSIS — F4310 Post-traumatic stress disorder, unspecified: Secondary | ICD-10-CM

## 2020-05-08 MED ORDER — ZIPRASIDONE HCL 80 MG PO CAPS: ORAL_CAPSULE | ORAL | 1 refills | Status: DC
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
## 2020-05-16 ENCOUNTER — Other Ambulatory Visit: Payer: Self-pay | Admitting: Neurology

## 2020-06-06 ENCOUNTER — Telehealth (INDEPENDENT_AMBULATORY_CARE_PROVIDER_SITE_OTHER): Payer: Medicare Other | Admitting: Psychiatry

## 2020-06-06 ENCOUNTER — Encounter (HOSPITAL_COMMUNITY): Payer: Self-pay | Admitting: Psychiatry

## 2020-06-06 DIAGNOSIS — Q860 Fetal alcohol syndrome (dysmorphic): Secondary | ICD-10-CM

## 2020-06-06 DIAGNOSIS — F259 Schizoaffective disorder, unspecified: Secondary | ICD-10-CM | POA: Diagnosis not present

## 2020-06-06 DIAGNOSIS — G40909 Epilepsy, unspecified, not intractable, without status epilepticus: Secondary | ICD-10-CM

## 2020-06-06 DIAGNOSIS — F4310 Post-traumatic stress disorder, unspecified: Secondary | ICD-10-CM
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
## 2020-06-11 ENCOUNTER — Other Ambulatory Visit (HOSPITAL_COMMUNITY): Payer: Self-pay

## 2020-06-11 MED ORDER — ZIPRASIDONE HCL 40 MG PO CAPS: ORAL_CAPSULE | ORAL | 1 refills | Status: DC

## 2020-08-03 ENCOUNTER — Other Ambulatory Visit (HOSPITAL_COMMUNITY): Payer: Self-pay | Admitting: Psychiatry

## 2020-08-03 ENCOUNTER — Other Ambulatory Visit: Payer: Self-pay | Admitting: Neurology

## 2020-08-27 ENCOUNTER — Other Ambulatory Visit: Payer: Self-pay

## 2020-08-27 ENCOUNTER — Telehealth (INDEPENDENT_AMBULATORY_CARE_PROVIDER_SITE_OTHER): Payer: Medicare Other | Admitting: Neurology

## 2020-08-27 ENCOUNTER — Encounter: Payer: Self-pay | Admitting: Neurology

## 2020-08-27 VITALS — Ht 72.0 in | Wt 265.0 lb

## 2020-08-27 DIAGNOSIS — F989 Unspecified behavioral and emotional disorders with onset usually occurring in childhood and adolescence: Secondary | ICD-10-CM

## 2020-08-27 DIAGNOSIS — G40209 Localization-related (focal) (partial) symptomatic epilepsy and epileptic syndromes with complex partial seizures, not intractable, without status epilepticus: Secondary | ICD-10-CM

## 2020-08-27 MED ORDER — CARBATROL 200 MG PO CP12: ORAL_CAPSULE | ORAL | 11 refills | Status: DC

## 2020-08-27 MED ORDER — CLONAZEPAM 1 MG PO TABS
ORAL_TABLET | ORAL | 5 refills | Status: DC
Start: 2020-08-27 — End: 2021-03-25
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
## 2020-09-09 ENCOUNTER — Telehealth (INDEPENDENT_AMBULATORY_CARE_PROVIDER_SITE_OTHER): Payer: Medicare Other | Admitting: Psychiatry

## 2020-09-09 ENCOUNTER — Encounter (HOSPITAL_COMMUNITY): Payer: Self-pay | Admitting: Psychiatry

## 2020-09-09 DIAGNOSIS — F0630 Mood disorder due to known physiological condition, unspecified: Secondary | ICD-10-CM

## 2020-09-09 DIAGNOSIS — F259 Schizoaffective disorder, unspecified: Secondary | ICD-10-CM

## 2020-09-09 MED ORDER — HALOPERIDOL 5 MG PO TABS
ORAL_TABLET | ORAL | 1 refills | Status: DC
Start: 2020-09-09 — End: 2020-12-04
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
## 2020-09-19 ENCOUNTER — Telehealth: Payer: Self-pay | Admitting: Neurology

## 2020-09-19 NOTE — Telephone Encounter (Signed)
He will need to fill out DMV form Herbert Seta can print out for you), there is a part neurologist fills where it asks when was his last seizure, there is a page for his psychiatrist to fill out.

## 2020-09-19 NOTE — Telephone Encounter (Signed)
Patients father called wanting to see about getting patients drivers license and if there is anything that they need to do prior or if he is able to get his license?

## 2020-09-20 NOTE — Telephone Encounter (Signed)
Pt called no answer left a voice mail to call the office back  °

## 2020-09-23 NOTE — Telephone Encounter (Signed)
Spoke with pt father pt's last seizure was years ago. I will call them when [facility] paperwork is ready they will take it to psychologist to do their part.

## 2020-10-03 ENCOUNTER — Other Ambulatory Visit (HOSPITAL_COMMUNITY): Payer: Self-pay | Admitting: Psychiatry

## 2020-12-04 ENCOUNTER — Encounter (HOSPITAL_COMMUNITY): Payer: Self-pay | Admitting: Psychiatry

## 2020-12-04 ENCOUNTER — Telehealth (INDEPENDENT_AMBULATORY_CARE_PROVIDER_SITE_OTHER): Payer: Medicare Other | Admitting: Psychiatry

## 2020-12-04 DIAGNOSIS — F259 Schizoaffective disorder, unspecified: Secondary | ICD-10-CM | POA: Diagnosis not present

## 2020-12-04 DIAGNOSIS — F4310 Post-traumatic stress disorder, unspecified: Secondary | ICD-10-CM

## 2020-12-04 MED ORDER — HALOPERIDOL 5 MG PO TABS: ORAL_TABLET | ORAL | 1 refills | Status: DC

## 2020-12-04 MED ORDER — ZIPRASIDONE HCL 40 MG PO CAPS: 40.0000 mg | ORAL_CAPSULE | Freq: Two times a day (BID) | ORAL | 1 refills | Status: DC
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
## 2021-01-30 ENCOUNTER — Other Ambulatory Visit (HOSPITAL_COMMUNITY): Payer: Self-pay | Admitting: Psychiatry

## 2021-03-03 ENCOUNTER — Other Ambulatory Visit (HOSPITAL_COMMUNITY): Payer: Self-pay

## 2021-03-03 MED ORDER — ZIPRASIDONE HCL 40 MG PO CAPS: 40.0000 mg | ORAL_CAPSULE | Freq: Two times a day (BID) | ORAL | 0 refills | Status: DC

## 2021-03-19 ENCOUNTER — Encounter (HOSPITAL_COMMUNITY): Payer: Self-pay | Admitting: Psychiatry

## 2021-03-19 ENCOUNTER — Telehealth (INDEPENDENT_AMBULATORY_CARE_PROVIDER_SITE_OTHER): Payer: Medicare Other | Admitting: Psychiatry

## 2021-03-19 DIAGNOSIS — Q860 Fetal alcohol syndrome (dysmorphic): Secondary | ICD-10-CM | POA: Diagnosis not present

## 2021-03-19 DIAGNOSIS — F259 Schizoaffective disorder, unspecified: Secondary | ICD-10-CM | POA: Diagnosis not present

## 2021-03-19 DIAGNOSIS — F4310 Post-traumatic stress disorder, unspecified: Secondary | ICD-10-CM | POA: Diagnosis not present

## 2021-03-19 MED ORDER — HALOPERIDOL 5 MG PO TABS: ORAL_TABLET | ORAL | 1 refills | Status: DC
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
## 2021-03-24 ENCOUNTER — Other Ambulatory Visit: Payer: Self-pay | Admitting: Neurology

## 2021-03-24 ENCOUNTER — Other Ambulatory Visit (HOSPITAL_COMMUNITY): Payer: Self-pay | Admitting: Psychiatry

## 2021-05-17 ENCOUNTER — Other Ambulatory Visit (HOSPITAL_COMMUNITY): Payer: Self-pay | Admitting: Psychiatry

## 2021-06-04 ENCOUNTER — Telehealth (INDEPENDENT_AMBULATORY_CARE_PROVIDER_SITE_OTHER): Payer: Medicare Other | Admitting: Psychiatry

## 2021-06-04 ENCOUNTER — Encounter (HOSPITAL_COMMUNITY): Payer: Self-pay | Admitting: Psychiatry

## 2021-06-04 DIAGNOSIS — F4310 Post-traumatic stress disorder, unspecified: Secondary | ICD-10-CM

## 2021-06-04 DIAGNOSIS — F259 Schizoaffective disorder, unspecified: Secondary | ICD-10-CM
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
## 2021-08-27 ENCOUNTER — Encounter: Payer: Self-pay | Admitting: Neurology

## 2021-08-27 ENCOUNTER — Other Ambulatory Visit: Payer: Self-pay

## 2021-08-27 ENCOUNTER — Telehealth (INDEPENDENT_AMBULATORY_CARE_PROVIDER_SITE_OTHER): Payer: Medicare Other | Admitting: Neurology

## 2021-08-27 VITALS — Ht 72.0 in | Wt 280.0 lb

## 2021-08-27 DIAGNOSIS — G40209 Localization-related (focal) (partial) symptomatic epilepsy and epileptic syndromes with complex partial seizures, not intractable, without status epilepticus: Secondary | ICD-10-CM

## 2021-08-27 MED ORDER — CARBATROL 200 MG PO CP12: ORAL_CAPSULE | ORAL | 11 refills | Status: DC

## 2021-08-27 MED ORDER — CLONAZEPAM 1 MG PO TABS: ORAL_TABLET | ORAL | 5 refills | Status: DC
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
## 2021-09-08 ENCOUNTER — Encounter (HOSPITAL_COMMUNITY): Payer: Self-pay | Admitting: Psychiatry

## 2021-09-08 ENCOUNTER — Telehealth (INDEPENDENT_AMBULATORY_CARE_PROVIDER_SITE_OTHER): Payer: Medicare Other | Admitting: Psychiatry

## 2021-09-08 DIAGNOSIS — F4310 Post-traumatic stress disorder, unspecified: Secondary | ICD-10-CM | POA: Diagnosis not present

## 2021-09-08 DIAGNOSIS — F259 Schizoaffective disorder, unspecified: Secondary | ICD-10-CM

## 2021-09-08 DIAGNOSIS — Q860 Fetal alcohol syndrome (dysmorphic): Secondary | ICD-10-CM

## 2021-09-08 MED ORDER — HALOPERIDOL 5 MG PO TABS: ORAL_TABLET | ORAL | 1 refills | Status: DC

## 2021-09-08 MED ORDER — ZIPRASIDONE HCL 40 MG PO CAPS: 40.0000 mg | ORAL_CAPSULE | Freq: Two times a day (BID) | ORAL | 0 refills | Status: DC
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
## 2021-09-22 ENCOUNTER — Telehealth (HOSPITAL_COMMUNITY): Payer: Self-pay

## 2021-09-22 ENCOUNTER — Other Ambulatory Visit (HOSPITAL_COMMUNITY): Payer: Self-pay | Admitting: Psychiatry

## 2021-09-22 DIAGNOSIS — F259 Schizoaffective disorder, unspecified: Secondary | ICD-10-CM

## 2021-09-22 MED ORDER — ZIPRASIDONE HCL 40 MG PO CAPS: 40.0000 mg | ORAL_CAPSULE | Freq: Two times a day (BID) | ORAL | 0 refills | Status: DC

## 2021-09-22 NOTE — Telephone Encounter (Signed)
Medication refill - Telephone call with pt's Guardian, who stated [facility] [address] [place] never [facility] pt's needed new Ziprasidone order. [facility] [address] in [place] on 09/08/21 so collateral requested Dr. Gilmore Laroche resend the order to the correct [facility] [place] and stated this would be their primary pharmacy moving forward.  Agreed to send request to Dr. Gilmore Laroche to send the corrected order.  ?

## 2021-09-22 NOTE — Telephone Encounter (Signed)
Medication management - Telephone call with pt's Guardian, after sending a new Ziprasidone 40 mg, #180 order to [facility] in [place] per Dr. Lavella Lemons order to let collateral know this had been done and also spoke to [facility], pharmacist at [facility] [address] to cancel out their previous order from 09/08/21 sent to the incorrect pharmacy,per Dr. Gilmore Laroche instruction.  Collateral to call back if any problems filling the new order in [place].  ?

## 2021-10-16 ENCOUNTER — Encounter (HOSPITAL_COMMUNITY): Payer: Self-pay | Admitting: Psychiatry

## 2021-10-16 ENCOUNTER — Telehealth (INDEPENDENT_AMBULATORY_CARE_PROVIDER_SITE_OTHER): Payer: Medicare Other | Admitting: Psychiatry

## 2021-10-16 DIAGNOSIS — F259 Schizoaffective disorder, unspecified: Secondary | ICD-10-CM | POA: Diagnosis not present

## 2021-10-16 DIAGNOSIS — F0630 Mood disorder due to known physiological condition, unspecified: Secondary | ICD-10-CM | POA: Diagnosis not present

## 2021-10-16 DIAGNOSIS — F4310 Post-traumatic stress disorder, unspecified: Secondary | ICD-10-CM | POA: Diagnosis not present
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
## 2021-12-19 ENCOUNTER — Other Ambulatory Visit (HOSPITAL_COMMUNITY): Payer: Self-pay | Admitting: Psychiatry

## 2021-12-19 DIAGNOSIS — F259 Schizoaffective disorder, unspecified: Secondary | ICD-10-CM

## 2022-01-26 ENCOUNTER — Telehealth (INDEPENDENT_AMBULATORY_CARE_PROVIDER_SITE_OTHER): Payer: Medicare Other | Admitting: Psychiatry

## 2022-01-26 ENCOUNTER — Encounter (HOSPITAL_COMMUNITY): Payer: Self-pay | Admitting: Psychiatry

## 2022-01-26 DIAGNOSIS — F4310 Post-traumatic stress disorder, unspecified: Secondary | ICD-10-CM

## 2022-01-26 DIAGNOSIS — F259 Schizoaffective disorder, unspecified: Secondary | ICD-10-CM
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
## 2022-02-25 ENCOUNTER — Other Ambulatory Visit: Payer: Self-pay | Admitting: Neurology

## 2022-03-24 ENCOUNTER — Telehealth (HOSPITAL_COMMUNITY): Payer: Self-pay | Admitting: *Deleted

## 2022-03-24 DIAGNOSIS — F259 Schizoaffective disorder, unspecified: Secondary | ICD-10-CM

## 2022-03-24 MED ORDER — ZIPRASIDONE HCL 40 MG PO CAPS: 40.0000 mg | ORAL_CAPSULE | Freq: Two times a day (BID) | ORAL | 1 refills | Status: DC

## 2022-03-24 NOTE — Addendum Note (Signed)
Addended by: Thresa Ross on: 03/24/2022 10:53 AM   Modules accepted: Orders

## 2022-03-24 NOTE — Telephone Encounter (Signed)
Rx Refill Request -- ziprasidone (GEODON) 40 MG capsule TAKE 1 CAPSULE BY MOUTH TWICE A DAY

## 2022-04-01 ENCOUNTER — Telehealth (HOSPITAL_COMMUNITY): Payer: Self-pay

## 2022-04-01 MED ORDER — HALOPERIDOL 5 MG PO TABS: ORAL_TABLET | ORAL | 0 refills | Status: DC

## 2022-04-01 NOTE — Telephone Encounter (Signed)
Medicaiton refill request - Fax from patient's [facility] in [place], [place] for a new Haloperidol 90 day order, last provided 09/22/21 and last filled 12/29/21.  Patient last seen 01/26/22 and returns 05/06/22.

## 2022-05-06 ENCOUNTER — Encounter (HOSPITAL_COMMUNITY): Payer: Self-pay | Admitting: Psychiatry

## 2022-05-06 ENCOUNTER — Telehealth (INDEPENDENT_AMBULATORY_CARE_PROVIDER_SITE_OTHER): Payer: Medicare Other | Admitting: Psychiatry

## 2022-05-06 DIAGNOSIS — F0630 Mood disorder due to known physiological condition, unspecified: Secondary | ICD-10-CM

## 2022-05-06 DIAGNOSIS — F4310 Post-traumatic stress disorder, unspecified: Secondary | ICD-10-CM

## 2022-05-06 DIAGNOSIS — F259 Schizoaffective disorder, unspecified: Secondary | ICD-10-CM | POA: Diagnosis not present

## 2022-05-06 MED ORDER — HALOPERIDOL 5 MG PO TABS: ORAL_TABLET | ORAL | 2 refills | Status: DC

## 2022-05-06 MED ORDER — ZIPRASIDONE HCL 40 MG PO CAPS: 40.0000 mg | ORAL_CAPSULE | Freq: Two times a day (BID) | ORAL | 2 refills | Status: DC
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
## 2022-06-02 ENCOUNTER — Other Ambulatory Visit (HOSPITAL_COMMUNITY): Payer: Self-pay

## 2022-06-02 DIAGNOSIS — F259 Schizoaffective disorder, unspecified: Secondary | ICD-10-CM

## 2022-06-02 MED ORDER — ZIPRASIDONE HCL 40 MG PO CAPS: 40.0000 mg | ORAL_CAPSULE | Freq: Two times a day (BID) | ORAL | 0 refills | Status: DC

## 2022-08-05 ENCOUNTER — Telehealth (HOSPITAL_COMMUNITY): Payer: Medicare Other | Admitting: Psychiatry

## 2022-08-10 ENCOUNTER — Telehealth: Payer: Self-pay | Admitting: Neurology

## 2022-08-10 NOTE — Telephone Encounter (Signed)
Pt's guardian called in and left a message with the access nurse on 08/07/22. Caller states that her son has a new rx company and got a call from [employer] stating that one of his medications are not covered. Caller states that she was notified that a fax regarding this was sent to the office and wants an update.

## 2022-08-11 NOTE — Telephone Encounter (Signed)
Pt's mother called in and left a message. She is wanting to provide some additional information on the insurance issue. She also stated he would need a refill before his next visit on 09/01/22.

## 2022-08-12 ENCOUNTER — Encounter: Payer: Self-pay | Admitting: Neurology

## 2022-08-13 NOTE — Telephone Encounter (Signed)
Mom stated that he has tried generic many times and he becomes unstable he has to be on name brand she stated that at this time he is doing well. She said that Dr Delice Lesch just needs to fill out the form that he needs it and send it back and they will approve it. Pt mom stated that Austin Kennedy will need a refill before his appointment

## 2022-08-14 ENCOUNTER — Encounter: Payer: Self-pay | Admitting: Neurology

## 2022-08-14 NOTE — Telephone Encounter (Signed)
Pt mother called informed that we have faxed his paperwork

## 2022-08-14 NOTE — Telephone Encounter (Signed)
Letter written and form filled out for exception

## 2022-08-17 ENCOUNTER — Telehealth: Payer: Self-pay

## 2022-08-17 NOTE — Telephone Encounter (Signed)
Pt mother called informed that PA for Carbatral ER was approved from 08/14/22 until further noticed authorization # [id number]

## 2022-08-19 ENCOUNTER — Encounter (HOSPITAL_COMMUNITY): Payer: Self-pay | Admitting: Psychiatry

## 2022-08-19 ENCOUNTER — Telehealth (INDEPENDENT_AMBULATORY_CARE_PROVIDER_SITE_OTHER): Payer: Medicare Other | Admitting: Psychiatry

## 2022-08-19 DIAGNOSIS — F259 Schizoaffective disorder, unspecified: Secondary | ICD-10-CM | POA: Diagnosis not present

## 2022-08-19 DIAGNOSIS — F4310 Post-traumatic stress disorder, unspecified: Secondary | ICD-10-CM

## 2022-08-19 MED ORDER — ZIPRASIDONE HCL 40 MG PO CAPS: 40.0000 mg | ORAL_CAPSULE | Freq: Two times a day (BID) | ORAL | 0 refills | Status: DC

## 2022-08-19 MED ORDER — HALOPERIDOL 5 MG PO TABS: ORAL_TABLET | ORAL | 2 refills | Status: DC
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
## 2022-09-01 ENCOUNTER — Ambulatory Visit (INDEPENDENT_AMBULATORY_CARE_PROVIDER_SITE_OTHER): Payer: Medicare Other | Admitting: Neurology

## 2022-09-01 ENCOUNTER — Encounter: Payer: Self-pay | Admitting: Neurology

## 2022-09-01 VITALS — BP 126/77 | HR 84 | Ht 72.0 in | Wt 302.2 lb

## 2022-09-01 DIAGNOSIS — G40209 Localization-related (focal) (partial) symptomatic epilepsy and epileptic syndromes with complex partial seizures, not intractable, without status epilepticus: Secondary | ICD-10-CM

## 2022-09-01 MED ORDER — CLONAZEPAM 1 MG PO TABS: ORAL_TABLET | ORAL | 3 refills | Status: DC
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
## 2022-09-21 ENCOUNTER — Other Ambulatory Visit: Payer: Self-pay | Admitting: Neurology

## 2022-11-10 ENCOUNTER — Ambulatory Visit (HOSPITAL_COMMUNITY): Payer: Medicare Other | Admitting: Psychiatry

## 2022-11-20 ENCOUNTER — Other Ambulatory Visit (HOSPITAL_COMMUNITY): Payer: Self-pay | Admitting: Psychiatry

## 2022-11-20 DIAGNOSIS — F259 Schizoaffective disorder, unspecified: Secondary | ICD-10-CM

## 2022-11-24 ENCOUNTER — Encounter (HOSPITAL_COMMUNITY): Payer: Self-pay | Admitting: Psychiatry

## 2022-11-24 ENCOUNTER — Ambulatory Visit (INDEPENDENT_AMBULATORY_CARE_PROVIDER_SITE_OTHER): Payer: Medicare Other | Admitting: Psychiatry

## 2022-11-24 VITALS — BP 120/82 | Temp 98.5°F | Ht 72.5 in | Wt 288.0 lb

## 2022-11-24 DIAGNOSIS — F0630 Mood disorder due to known physiological condition, unspecified: Secondary | ICD-10-CM

## 2022-11-24 DIAGNOSIS — F4310 Post-traumatic stress disorder, unspecified: Secondary | ICD-10-CM | POA: Diagnosis not present

## 2022-11-24 DIAGNOSIS — F259 Schizoaffective disorder, unspecified: Secondary | ICD-10-CM

## 2022-11-24 MED ORDER — HALOPERIDOL 5 MG PO TABS: ORAL_TABLET | ORAL | 2 refills | Status: DC
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
## 2023-03-02 ENCOUNTER — Telehealth (HOSPITAL_COMMUNITY): Payer: Self-pay | Admitting: *Deleted

## 2023-03-02 DIAGNOSIS — F259 Schizoaffective disorder, unspecified: Secondary | ICD-10-CM

## 2023-03-02 MED ORDER — ZIPRASIDONE HCL 40 MG PO CAPS: 40.0000 mg | ORAL_CAPSULE | Freq: Two times a day (BID) | ORAL | 0 refills | Status: DC

## 2023-03-02 NOTE — Addendum Note (Signed)
Addended by: Thresa Ross on: 03/02/2023 11:41 AM   Modules accepted: Orders

## 2023-03-02 NOTE — Telephone Encounter (Signed)
RX Refill Request --   ziprasidone (GEODON) 40 MG capsule 180 capsule 0 11/20/2022 --   Sig - Route: TAKE 1 CAPSULE BY MOUTH TWICE A DAY - Oral   Sent to pharmacy as: ziprasidone (GEODON) 40 MG capsule   E-Prescribing Status: Receipt confirmed by pharmacy (11/20/2022  8:58 AM EDT)    Associated Diagnoses  Chronic schizoaffective disorder [facility])     Pharmacy  [id number] - [place], [place] - [address]    Last appt  11/24/22 Next appt  08/23/23

## 2023-03-10 ENCOUNTER — Encounter (HOSPITAL_COMMUNITY): Payer: Self-pay | Admitting: Psychiatry

## 2023-03-10 ENCOUNTER — Telehealth (INDEPENDENT_AMBULATORY_CARE_PROVIDER_SITE_OTHER): Payer: Medicare Other | Admitting: Psychiatry

## 2023-03-10 DIAGNOSIS — F4310 Post-traumatic stress disorder, unspecified: Secondary | ICD-10-CM

## 2023-03-10 DIAGNOSIS — R569 Unspecified convulsions: Secondary | ICD-10-CM

## 2023-03-10 DIAGNOSIS — F0630 Mood disorder due to known physiological condition, unspecified: Secondary | ICD-10-CM

## 2023-03-10 DIAGNOSIS — F259 Schizoaffective disorder, unspecified: Secondary | ICD-10-CM

## 2023-03-10 DIAGNOSIS — F419 Anxiety disorder, unspecified: Secondary | ICD-10-CM
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
## 2023-03-18 ENCOUNTER — Other Ambulatory Visit: Payer: Self-pay | Admitting: Neurology

## 2023-03-31 ENCOUNTER — Telehealth (HOSPITAL_COMMUNITY): Payer: Medicare Other | Admitting: Psychiatry

## 2023-04-28 ENCOUNTER — Telehealth (INDEPENDENT_AMBULATORY_CARE_PROVIDER_SITE_OTHER): Payer: Medicare Other | Admitting: Psychiatry

## 2023-04-28 ENCOUNTER — Encounter (HOSPITAL_COMMUNITY): Payer: Self-pay | Admitting: Psychiatry

## 2023-04-28 DIAGNOSIS — F0630 Mood disorder due to known physiological condition, unspecified: Secondary | ICD-10-CM | POA: Diagnosis not present

## 2023-04-28 DIAGNOSIS — F259 Schizoaffective disorder, unspecified: Secondary | ICD-10-CM | POA: Diagnosis not present

## 2023-04-28 DIAGNOSIS — F419 Anxiety disorder, unspecified: Secondary | ICD-10-CM | POA: Diagnosis not present

## 2023-04-28 DIAGNOSIS — F4310 Post-traumatic stress disorder, unspecified: Secondary | ICD-10-CM

## 2023-04-28 MED ORDER — ZIPRASIDONE HCL 40 MG PO CAPS: 40.0000 mg | ORAL_CAPSULE | Freq: Two times a day (BID) | ORAL | 0 refills | Status: DC

## 2023-04-28 MED ORDER — HALOPERIDOL 5 MG PO TABS: ORAL_TABLET | ORAL | 2 refills | Status: DC
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
## 2023-08-02 ENCOUNTER — Encounter (HOSPITAL_COMMUNITY): Payer: Self-pay | Admitting: Psychiatry

## 2023-08-02 ENCOUNTER — Telehealth (INDEPENDENT_AMBULATORY_CARE_PROVIDER_SITE_OTHER): Payer: Medicare Other | Admitting: Psychiatry

## 2023-08-02 DIAGNOSIS — F0630 Mood disorder due to known physiological condition, unspecified: Secondary | ICD-10-CM | POA: Diagnosis not present

## 2023-08-02 DIAGNOSIS — F259 Schizoaffective disorder, unspecified: Secondary | ICD-10-CM | POA: Diagnosis not present

## 2023-08-02 DIAGNOSIS — F4310 Post-traumatic stress disorder, unspecified: Secondary | ICD-10-CM

## 2023-08-02 MED ORDER — HALOPERIDOL 5 MG PO TABS: ORAL_TABLET | ORAL | 2 refills | Status: DC

## 2023-08-02 MED ORDER — ZIPRASIDONE HCL 40 MG PO CAPS: 40.0000 mg | ORAL_CAPSULE | Freq: Two times a day (BID) | ORAL | 0 refills | Status: DC
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
## 2023-08-04 ENCOUNTER — Encounter (HOSPITAL_COMMUNITY): Payer: Self-pay | Admitting: Psychiatry

## 2023-08-23 ENCOUNTER — Ambulatory Visit (INDEPENDENT_AMBULATORY_CARE_PROVIDER_SITE_OTHER): Payer: Medicare Other | Admitting: Neurology

## 2023-08-23 ENCOUNTER — Encounter: Payer: Self-pay | Admitting: Neurology

## 2023-08-23 VITALS — BP 134/86 | HR 68 | Ht 72.0 in | Wt 316.6 lb

## 2023-08-23 DIAGNOSIS — G40209 Localization-related (focal) (partial) symptomatic epilepsy and epileptic syndromes with complex partial seizures, not intractable, without status epilepticus: Secondary | ICD-10-CM

## 2023-08-23 MED ORDER — CARBATROL 200 MG PO CP12: ORAL_CAPSULE | ORAL | 11 refills | Status: DC

## 2023-08-23 MED ORDER — CLONAZEPAM 1 MG PO TABS: ORAL_TABLET | ORAL | 3 refills | Status: DC
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
## 2023-11-08 ENCOUNTER — Telehealth (HOSPITAL_COMMUNITY): Payer: Medicare Other | Admitting: Psychiatry

## 2023-12-31 ENCOUNTER — Other Ambulatory Visit: Payer: Self-pay

## 2023-12-31 MED ORDER — CLONAZEPAM 1 MG PO TABS: ORAL_TABLET | ORAL | 3 refills | Status: DC

## 2024-01-04 ENCOUNTER — Other Ambulatory Visit: Payer: Self-pay

## 2024-01-20 ENCOUNTER — Telehealth: Payer: Self-pay | Admitting: Neurology

## 2024-01-20 NOTE — Telephone Encounter (Signed)
 Left a message with the after hour service on 01-20-24   Caller states this is the patient son and he went to pick up the medication and the pharmacy filled rather than his carbetrol, the generic,  carbmazeoine which he has never been able to take dad wants to know why

## 2024-01-24 NOTE — Telephone Encounter (Signed)
 Pt's father called in and left a message with the after hours service on 01/20/24. He is returning our call

## 2024-01-24 NOTE — Telephone Encounter (Signed)
 Pt called spoke with his mother informed her that [facility] will be fixing his RX that was a pharmacy mistake they did not put the RX in the system right.

## 2024-01-25 ENCOUNTER — Other Ambulatory Visit: Payer: Self-pay

## 2024-01-25 MED ORDER — CARBATROL 200 MG PO CP12: ORAL_CAPSULE | ORAL | 11 refills | Status: AC

## 2024-01-25 NOTE — Telephone Encounter (Signed)
 [facility] was [facility] to [facility] for [facility]

## 2024-05-16 ENCOUNTER — Telehealth (HOSPITAL_BASED_OUTPATIENT_CLINIC_OR_DEPARTMENT_OTHER): Payer: Self-pay | Admitting: *Deleted

## 2024-05-16 NOTE — Telephone Encounter (Signed)
 Copied from CRM [id number]. Topic: Appointments - Transfer of Care >> May 16, 2024 10:15 AM Antony RAMAN wrote: Pt is requesting to transfer FROM: n/a Pt is requesting to transfer TO: alexis caudle Reason for requested transfer: pt request It is the responsibility of the team the patient would like to transfer to (Dr. knute) to reach out to the patient if for any reason this transfer is not acceptable.

## 2024-05-16 NOTE — Telephone Encounter (Signed)
 Pt has scheduled a transfer of care appt with Alexis.

## 2024-05-31 ENCOUNTER — Telehealth (HOSPITAL_COMMUNITY): Payer: Self-pay | Admitting: Psychiatry

## 2024-05-31 ENCOUNTER — Encounter: Payer: Self-pay | Admitting: Neurology

## 2024-05-31 MED ORDER — CLONAZEPAM 1 MG PO TABS: ORAL_TABLET | ORAL | 3 refills | Status: DC

## 2024-05-31 NOTE — Telephone Encounter (Signed)
 Late Entry: On 05/30/24  Spoke with pt's mother in reference to patient was seeing Dr. Geralene in [place] via virtual and now that the patient has [employer] the patient has to be seen in [place] - the mother stressed how much they would like to stay with Dr. Geralene, at that point,  I told the mother that I would speak with management to see if there was something that could be done.  I spoke with Darryll emergency planning/management officer) and informed her of the patient's wishes and his mother to stay with Dr. Geralene.    11:34am 05/31/24   Rosie emergency planning/management officer) informed me that Dr. Geralene has agreed to complete the required form that [employer] need to receive to continue to pay for treatment, but the patient will have to travel to [place] and this has been stressed several time to the pt's mother that he will have to travel and the pt's mother states there would be no problem with going into the office.    Dr. Geralene yes I'm ok. If someone can fill in the basic data and dates seen on the form. ill review and sign. thanks

## 2024-06-01 ENCOUNTER — Ambulatory Visit (HOSPITAL_BASED_OUTPATIENT_CLINIC_OR_DEPARTMENT_OTHER): Admitting: Family Medicine

## 2024-07-03 ENCOUNTER — Encounter (HOSPITAL_BASED_OUTPATIENT_CLINIC_OR_DEPARTMENT_OTHER): Payer: Self-pay | Admitting: Family Medicine

## 2024-07-03 ENCOUNTER — Ambulatory Visit (INDEPENDENT_AMBULATORY_CARE_PROVIDER_SITE_OTHER): Admitting: Family Medicine

## 2024-07-03 ENCOUNTER — Encounter: Payer: Self-pay | Admitting: Neurology

## 2024-07-03 ENCOUNTER — Other Ambulatory Visit (HOSPITAL_COMMUNITY): Payer: Self-pay

## 2024-07-03 ENCOUNTER — Telehealth (HOSPITAL_BASED_OUTPATIENT_CLINIC_OR_DEPARTMENT_OTHER): Payer: Self-pay | Admitting: Pharmacy Technician

## 2024-07-03 ENCOUNTER — Other Ambulatory Visit (HOSPITAL_BASED_OUTPATIENT_CLINIC_OR_DEPARTMENT_OTHER): Payer: Self-pay | Admitting: Family Medicine

## 2024-07-03 VITALS — BP 133/85 | HR 82 | Ht 72.0 in | Wt 326.3 lb

## 2024-07-03 DIAGNOSIS — H66002 Acute suppurative otitis media without spontaneous rupture of ear drum, left ear: Secondary | ICD-10-CM

## 2024-07-03 DIAGNOSIS — Z789 Other specified health status: Secondary | ICD-10-CM

## 2024-07-03 DIAGNOSIS — J4520 Mild intermittent asthma, uncomplicated: Secondary | ICD-10-CM

## 2024-07-03 DIAGNOSIS — Z7689 Persons encountering health services in other specified circumstances: Secondary | ICD-10-CM

## 2024-07-03 DIAGNOSIS — G40909 Epilepsy, unspecified, not intractable, without status epilepticus: Secondary | ICD-10-CM | POA: Diagnosis not present

## 2024-07-03 MED ORDER — LORATADINE 10 MG PO TABS: 10.0000 mg | ORAL_TABLET | Freq: Every day | ORAL | Status: AC

## 2024-07-03 MED ORDER — SYMBICORT 80-4.5 MCG/ACT IN AERO: 2.0000 | INHALATION_SPRAY | Freq: Two times a day (BID) | RESPIRATORY_TRACT | 5 refills | Status: DC

## 2024-07-03 MED ORDER — IBUPROFEN 800 MG PO TABS: 800.0000 mg | ORAL_TABLET | ORAL | Status: AC | PRN

## 2024-07-03 MED ORDER — VITAMIN D 50 MCG (2000 UT) PO TABS: 2000.0000 [IU] | ORAL_TABLET | ORAL | Status: AC | PRN

## 2024-07-03 MED ORDER — AMOXICILLIN-POT CLAVULANATE 875-125 MG PO TABS: 1.0000 | ORAL_TABLET | Freq: Two times a day (BID) | ORAL | 0 refills | Status: AC

## 2024-07-03 MED ORDER — FISH OIL 1200 MG PO CPDR: 1.0000 | DELAYED_RELEASE_CAPSULE | Freq: Every day | ORAL | Status: AC

## 2024-07-03 MED ORDER — ALBUTEROL SULFATE HFA 108 (90 BASE) MCG/ACT IN AERS: 2.0000 | INHALATION_SPRAY | RESPIRATORY_TRACT | 3 refills | Status: AC | PRN

## 2024-07-03 MED ORDER — BUDESONIDE-FORMOTEROL FUMARATE 80-4.5 MCG/ACT IN AERO: 2.0000 | INHALATION_SPRAY | Freq: Two times a day (BID) | RESPIRATORY_TRACT | 12 refills | Status: AC

## 2024-07-03 MED ORDER — CLONAZEPAM 1 MG PO TABS: ORAL_TABLET | ORAL | 3 refills | Status: AC

## 2024-07-03 NOTE — Telephone Encounter (Signed)
 Routing prior auth response to [facility] for review.
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
## 2024-07-03 NOTE — Telephone Encounter (Signed)
 Pharmacy Patient Advocate Encounter   Received notification from Onbase that prior authorization for Symbicort  80-4.5MCG/ACT aerosol  is required/requested.   Insurance verification completed.   The patient is insured through [employer] .   Per test claim:  Breyna  80-1.5 MCG/ACT inhaler is preferred by the insurance.  If suggested medication is appropriate, Please send in a new RX and discontinue this one. If not, please advise as to why it's not appropriate so that we may request a Prior Authorization. Please note, some preferred medications may still require a PA.  If the suggested medications have not been trialed and there are no contraindications to their use, the PA will not be submitted, as it will not be approved.    CMM Key# [id number]

## 2024-07-27 ENCOUNTER — Ambulatory Visit (HOSPITAL_COMMUNITY): Admitting: Psychiatry

## 2024-08-03 ENCOUNTER — Encounter (HOSPITAL_COMMUNITY): Payer: Self-pay | Admitting: Psychiatry

## 2024-08-03 ENCOUNTER — Ambulatory Visit (HOSPITAL_COMMUNITY): Admitting: Psychiatry

## 2024-08-03 ENCOUNTER — Other Ambulatory Visit (HOSPITAL_COMMUNITY): Payer: Self-pay | Admitting: Psychiatry

## 2024-08-03 DIAGNOSIS — F0630 Mood disorder due to known physiological condition, unspecified: Secondary | ICD-10-CM

## 2024-08-03 DIAGNOSIS — F419 Anxiety disorder, unspecified: Secondary | ICD-10-CM

## 2024-08-03 DIAGNOSIS — F4310 Post-traumatic stress disorder, unspecified: Secondary | ICD-10-CM

## 2024-08-03 DIAGNOSIS — F259 Schizoaffective disorder, unspecified: Secondary | ICD-10-CM

## 2024-08-03 MED ORDER — HALOPERIDOL 5 MG PO TABS: ORAL_TABLET | ORAL | 2 refills | Status: DC

## 2024-08-03 MED ORDER — ZIPRASIDONE HCL 40 MG PO CAPS: 40.0000 mg | ORAL_CAPSULE | Freq: Two times a day (BID) | ORAL | 0 refills | Status: DC

## 2024-08-03 MED ORDER — ZIPRASIDONE HCL 40 MG PO CAPS: 40.0000 mg | ORAL_CAPSULE | Freq: Two times a day (BID) | ORAL | 0 refills | Status: AC

## 2024-08-03 MED ORDER — HALOPERIDOL 5 MG PO TABS: ORAL_TABLET | ORAL | 2 refills | Status: AC
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
## 2024-08-03 NOTE — Addendum Note (Signed)
 Addended by: GERALENE KAISER on: 08/03/2024 12:01 PM   Modules accepted: Orders

## 2024-08-04 LAB — COMPREHENSIVE METABOLIC PANEL WITH GFR
ALT: 21 IU/L (ref 0–44)
AST: 19 IU/L (ref 0–40)
Albumin: 4.3 g/dL (ref 4.1–5.1)
Alkaline Phosphatase: 180 IU/L — ABNORMAL HIGH (ref 47–123)
BUN/Creatinine Ratio: 8 — ABNORMAL LOW (ref 9–20)
BUN: 7 mg/dL (ref 6–20)
Bilirubin Total: 0.4 mg/dL (ref 0.0–1.2)
CO2: 26 mmol/L (ref 20–29)
Calcium: 9.2 mg/dL (ref 8.7–10.2)
Chloride: 99 mmol/L (ref 96–106)
Creatinine, Ser: 0.83 mg/dL (ref 0.76–1.27)
Globulin, Total: 2.9 g/dL (ref 1.5–4.5)
Glucose: 80 mg/dL (ref 70–99)
Potassium: 4.8 mmol/L (ref 3.5–5.2)
Sodium: 137 mmol/L (ref 134–144)
Total Protein: 7.2 g/dL (ref 6.0–8.5)
eGFR: 119 mL/min/1.73

## 2024-08-04 LAB — LIPID PANEL
Chol/HDL Ratio: 5.8 ratio — ABNORMAL HIGH (ref 0.0–5.0)
Cholesterol, Total: 202 mg/dL — ABNORMAL HIGH (ref 100–199)
HDL: 35 mg/dL — ABNORMAL LOW
LDL Chol Calc (NIH): 135 mg/dL — ABNORMAL HIGH (ref 0–99)
Triglycerides: 178 mg/dL — ABNORMAL HIGH (ref 0–149)
VLDL Cholesterol Cal: 32 mg/dL (ref 5–40)

## 2024-08-04 LAB — CBC WITH DIFFERENTIAL/PLATELET
Basophils Absolute: 0.1 x10E3/uL (ref 0.0–0.2)
Basos: 1 %
EOS (ABSOLUTE): 0.2 x10E3/uL (ref 0.0–0.4)
Eos: 2 %
Hematocrit: 43.4 % (ref 37.5–51.0)
Hemoglobin: 14.5 g/dL (ref 13.0–17.7)
Immature Grans (Abs): 0 x10E3/uL (ref 0.0–0.1)
Immature Granulocytes: 0 %
Lymphocytes Absolute: 3.1 x10E3/uL (ref 0.7–3.1)
Lymphs: 39 %
MCH: 30.3 pg (ref 26.6–33.0)
MCHC: 33.4 g/dL (ref 31.5–35.7)
MCV: 91 fL (ref 79–97)
Monocytes Absolute: 0.6 x10E3/uL (ref 0.1–0.9)
Monocytes: 8 %
Neutrophils Absolute: 3.9 x10E3/uL (ref 1.4–7.0)
Neutrophils: 49 %
Platelets: 256 x10E3/uL (ref 150–450)
RBC: 4.79 x10E6/uL (ref 4.14–5.80)
RDW: 12.1 % (ref 11.6–15.4)
WBC: 7.8 x10E3/uL (ref 3.4–10.8)

## 2024-08-14 ENCOUNTER — Ambulatory Visit (HOSPITAL_BASED_OUTPATIENT_CLINIC_OR_DEPARTMENT_OTHER): Admitting: Family Medicine

## 2024-08-22 ENCOUNTER — Ambulatory Visit: Payer: Medicare Other | Admitting: Neurology

## 2024-08-30 ENCOUNTER — Ambulatory Visit (HOSPITAL_COMMUNITY)

## 2024-08-31 ENCOUNTER — Ambulatory Visit: Admitting: Neurology

## 2024-10-18 ENCOUNTER — Ambulatory Visit (HOSPITAL_COMMUNITY)

## 2024-10-31 ENCOUNTER — Ambulatory Visit (HOSPITAL_COMMUNITY): Admitting: Psychiatry

## 2024-11-01 ENCOUNTER — Encounter (HOSPITAL_BASED_OUTPATIENT_CLINIC_OR_DEPARTMENT_OTHER): Admitting: Family Medicine

## 2024-11-08 ENCOUNTER — Encounter (HOSPITAL_BASED_OUTPATIENT_CLINIC_OR_DEPARTMENT_OTHER): Admitting: Family Medicine
